# Patient Record
Sex: Male | Born: 1935 | Race: White | Hispanic: No | Marital: Single | Smoking: Current every day smoker
Health system: Southern US, Community
[De-identification: ages and names within clinical notes are randomized; demographics above are authoritative.]

## PROBLEM LIST (undated history)

## (undated) DIAGNOSIS — I10 Essential (primary) hypertension: Secondary | ICD-10-CM

## (undated) DIAGNOSIS — F0390 Unspecified dementia without behavioral disturbance: Secondary | ICD-10-CM

## (undated) DIAGNOSIS — C801 Malignant (primary) neoplasm, unspecified: Secondary | ICD-10-CM

## (undated) HISTORY — PX: PANCREAS SURGERY: SHX731

---

## 2007-10-29 ENCOUNTER — Emergency Department (HOSPITAL_COMMUNITY): Admission: EM | Admit: 2007-10-29 | Discharge: 2007-10-30 | Payer: Self-pay | Admitting: Emergency Medicine

## 2014-06-17 ENCOUNTER — Emergency Department (HOSPITAL_COMMUNITY): Payer: Non-veteran care

## 2014-06-17 ENCOUNTER — Encounter (HOSPITAL_COMMUNITY): Payer: Self-pay | Admitting: Emergency Medicine

## 2014-06-17 ENCOUNTER — Emergency Department (HOSPITAL_COMMUNITY)
Admission: EM | Admit: 2014-06-17 | Discharge: 2014-06-18 | Disposition: A | Discharge status: Other Acute Inpatient Hospital | Payer: Non-veteran care | Attending: Emergency Medicine | Admitting: Emergency Medicine

## 2014-06-17 DIAGNOSIS — Z8507 Personal history of malignant neoplasm of pancreas: Secondary | ICD-10-CM | POA: Diagnosis not present

## 2014-06-17 DIAGNOSIS — R0600 Dyspnea, unspecified: Secondary | ICD-10-CM

## 2014-06-17 DIAGNOSIS — R0602 Shortness of breath: Secondary | ICD-10-CM

## 2014-06-17 DIAGNOSIS — R059 Cough, unspecified: Secondary | ICD-10-CM

## 2014-06-17 DIAGNOSIS — R05 Cough: Secondary | ICD-10-CM

## 2014-06-17 DIAGNOSIS — M6281 Muscle weakness (generalized): Secondary | ICD-10-CM | POA: Diagnosis present

## 2014-06-17 DIAGNOSIS — I10 Essential (primary) hypertension: Secondary | ICD-10-CM | POA: Diagnosis not present

## 2014-06-17 DIAGNOSIS — J449 Chronic obstructive pulmonary disease, unspecified: Secondary | ICD-10-CM

## 2014-06-17 DIAGNOSIS — F0390 Unspecified dementia without behavioral disturbance: Secondary | ICD-10-CM | POA: Diagnosis not present

## 2014-06-17 DIAGNOSIS — R109 Unspecified abdominal pain: Secondary | ICD-10-CM | POA: Diagnosis not present

## 2014-06-17 DIAGNOSIS — J42 Unspecified chronic bronchitis: Secondary | ICD-10-CM

## 2014-06-17 HISTORY — DX: Malignant (primary) neoplasm, unspecified: C80.1

## 2014-06-17 HISTORY — DX: Unspecified dementia, unspecified severity, without behavioral disturbance, psychotic disturbance, mood disturbance, and anxiety: F03.90

## 2014-06-17 HISTORY — DX: Essential (primary) hypertension: I10

## 2014-06-17 LAB — CBC WITH DIFFERENTIAL/PLATELET
BASOS ABS: 0 10*3/uL (ref 0.0–0.1)
Basophils Relative: 0 % (ref 0–1)
Eosinophils Absolute: 0 10*3/uL (ref 0.0–0.7)
Eosinophils Relative: 0 % (ref 0–5)
HEMATOCRIT: 33.9 % — AB (ref 39.0–52.0)
HEMOGLOBIN: 11 g/dL — AB (ref 13.0–17.0)
LYMPHS PCT: 7 % — AB (ref 12–46)
Lymphs Abs: 0.6 10*3/uL — ABNORMAL LOW (ref 0.7–4.0)
MCH: 30.5 pg (ref 26.0–34.0)
MCHC: 32.4 g/dL (ref 30.0–36.0)
MCV: 93.9 fL (ref 78.0–100.0)
MONO ABS: 0.6 10*3/uL (ref 0.1–1.0)
MONOS PCT: 6 % (ref 3–12)
NEUTROS ABS: 7.9 10*3/uL — AB (ref 1.7–7.7)
Neutrophils Relative %: 87 % — ABNORMAL HIGH (ref 43–77)
Platelets: 249 10*3/uL (ref 150–400)
RBC: 3.61 MIL/uL — ABNORMAL LOW (ref 4.22–5.81)
RDW: 13.2 % (ref 11.5–15.5)
WBC: 9.1 10*3/uL (ref 4.0–10.5)

## 2014-06-17 LAB — URINALYSIS, ROUTINE W REFLEX MICROSCOPIC
Bilirubin Urine: NEGATIVE
GLUCOSE, UA: NEGATIVE mg/dL
Ketones, ur: 15 mg/dL — AB
LEUKOCYTES UA: NEGATIVE
Nitrite: NEGATIVE
PH: 6 (ref 5.0–8.0)
Protein, ur: NEGATIVE mg/dL
SPECIFIC GRAVITY, URINE: 1.015 (ref 1.005–1.030)
Urobilinogen, UA: 0.2 mg/dL (ref 0.0–1.0)

## 2014-06-17 LAB — URINE MICROSCOPIC-ADD ON

## 2014-06-17 LAB — HEPATIC FUNCTION PANEL
ALBUMIN: 3 g/dL — AB (ref 3.5–5.2)
ALT: 7 U/L (ref 0–53)
AST: 15 U/L (ref 0–37)
Alkaline Phosphatase: 126 U/L — ABNORMAL HIGH (ref 39–117)
TOTAL PROTEIN: 7 g/dL (ref 6.0–8.3)
Total Bilirubin: 0.5 mg/dL (ref 0.3–1.2)

## 2014-06-17 LAB — BASIC METABOLIC PANEL
ANION GAP: 11 (ref 5–15)
BUN: 21 mg/dL (ref 6–23)
CHLORIDE: 104 meq/L (ref 96–112)
CO2: 29 meq/L (ref 19–32)
CREATININE: 0.99 mg/dL (ref 0.50–1.35)
Calcium: 8.8 mg/dL (ref 8.4–10.5)
GFR calc Af Amer: 88 mL/min — ABNORMAL LOW (ref >90)
GFR calc non Af Amer: 76 mL/min — ABNORMAL LOW (ref >90)
Glucose, Bld: 118 mg/dL — ABNORMAL HIGH (ref 70–99)
POTASSIUM: 3.2 meq/L — AB (ref 3.7–5.3)
Sodium: 144 mEq/L (ref 137–147)

## 2014-06-17 LAB — LIPASE, BLOOD: Lipase: 9 U/L — ABNORMAL LOW (ref 11–59)

## 2014-06-17 LAB — D-DIMER, QUANTITATIVE: D-Dimer, Quant: 1.03 ug/mL-FEU — ABNORMAL HIGH (ref 0.00–0.48)

## 2014-06-17 MED ORDER — SODIUM CHLORIDE 0.9 % IV BOLUS (SEPSIS)
500.0000 mL | Freq: Once | INTRAVENOUS | Status: AC
  Administered 2014-06-17: 500 mL via INTRAVENOUS

## 2014-06-17 MED ORDER — POTASSIUM CHLORIDE CRYS ER 20 MEQ PO TBCR
40.0000 meq | EXTENDED_RELEASE_TABLET | Freq: Once | ORAL | Status: AC
  Administered 2014-06-17: 40 meq via ORAL
  Filled 2014-06-17: qty 2

## 2014-06-17 MED ORDER — IOHEXOL 350 MG/ML SOLN
100.0000 mL | Freq: Once | INTRAVENOUS | Status: AC | PRN
  Administered 2014-06-17: 100 mL via INTRAVENOUS

## 2014-06-17 MED ORDER — ALBUTEROL SULFATE (2.5 MG/3ML) 0.083% IN NEBU
5.0000 mg | INHALATION_SOLUTION | Freq: Once | RESPIRATORY_TRACT | Status: AC
  Administered 2014-06-17: 5 mg via RESPIRATORY_TRACT
  Filled 2014-06-17: qty 6

## 2014-06-17 MED ORDER — IOHEXOL 300 MG/ML  SOLN
25.0000 mL | INTRAMUSCULAR | Status: AC
  Administered 2014-06-17 (×2): 25 mL via ORAL

## 2014-06-17 MED ORDER — DEXTROSE 5 % IV SOLN
500.0000 mg | Freq: Once | INTRAVENOUS | Status: AC
  Administered 2014-06-17: 500 mg via INTRAVENOUS
  Filled 2014-06-17 (×2): qty 500

## 2014-06-17 MED ORDER — DEXTROSE 5 % IV SOLN
1.0000 g | Freq: Once | INTRAVENOUS | Status: AC
  Administered 2014-06-17: 1 g via INTRAVENOUS
  Filled 2014-06-17: qty 10

## 2014-06-17 MED ORDER — METHYLPREDNISOLONE SODIUM SUCC 125 MG IJ SOLR
125.0000 mg | Freq: Once | INTRAMUSCULAR | Status: AC
  Administered 2014-06-17: 125 mg via INTRAVENOUS
  Filled 2014-06-17: qty 2

## 2014-06-17 NOTE — ED Notes (Signed)
Pt is finish with contrast.

## 2014-06-17 NOTE — ED Notes (Signed)
Pt transported to [place] for CT scan

## 2014-06-17 NOTE — ED Notes (Signed)
CT informed pt finished contrast 

## 2014-06-17 NOTE — ED Notes (Signed)
MD at bedside. 

## 2014-06-17 NOTE — ED Notes (Signed)
Per EMS, states patient is supposed to returned to [facility] after CT scan.

## 2014-06-17 NOTE — ED Notes (Signed)
Talked with

## 2014-06-17 NOTE — ED Notes (Signed)
Patient transported to CT 

## 2014-06-17 NOTE — ED Provider Notes (Signed)
CSN: [id number]     Arrival date & time 06/17/14  1336 History  This chart was scribed for Shaune Pollack, MD by Steva Colder, ED Scribe. The patient was seen in room APA14/APA14 at 2:58 PM.     Chief Complaint  Patient presents with  . Weakness    Patient is a 80 y.o. male presenting with weakness.  Weakness This is a new problem. The current episode started more than 1 week ago. The problem occurs constantly. The problem has not changed since onset.Associated symptoms include abdominal pain and shortness of breath.   HPI Comments: Connor Cantu is a 80 y.o. male who presents to the Emergency Department complaining of weakness onset 1.5 weeks. He states that he is having associated symptoms of congestion, coughing, sneezing, SOB, appetite change. He states that he is drinking water just fine. He states that his cough is productive of yellow-green sputum.    He denies fever, and any other symptoms. He states that he is a smoker. He states that he smokes 3-8 ciggarettes a day. He states that he has a nebulizer and two inhalers. He states that he using them. He states that he was seen at [facility] last week so that his medicine could be increased for his dementia. He states that he lives with his granddaughter. He takes pancreatic enzymes. He states that he had pancreatic CA. He denies pneumonia shot in the last 3-4 years. He states that he is allergic to penicillin. Granddaughter states that when the pt gets steroids, the patients dementia will began..   Past Medical History  Diagnosis Date  . Hypertension   . Dementia   . Cancer     pancreatic   Past Surgical History  Procedure Laterality Date  . Pancreas surgery      whipple   History reviewed. No pertinent family history. History  Substance Use Topics  . Smoking status: Current Every Day Smoker -- 1.00 packs/day  . Smokeless tobacco: Not on file  . Alcohol Use: Not on file    Review of Systems  Constitutional: Positive for appetite  change. Negative for fever.  HENT: Positive for congestion and sneezing.   Respiratory: Positive for cough and shortness of breath.   Gastrointestinal: Positive for abdominal pain.  Neurological: Positive for weakness.  All other systems reviewed and are negative.   Allergies  Penicillins  Home Medications   Prior to Admission medications   Not on File   BP 140/89  Pulse 61  Temp(Src) 98.2 F (36.8 C) (Oral)  Resp 16  SpO2 95%  Physical Exam  Nursing note and vitals reviewed. Constitutional: He is oriented to person, place, and time. He appears well-developed. He appears cachectic.  HENT:  Head: Normocephalic and atraumatic.  Mouth/Throat: Oropharynx is clear and moist.  Bridge noted   Eyes: Conjunctivae and EOM are normal. Pupils are equal, round, and reactive to light.  Neck: Normal range of motion. Neck supple.  Cardiovascular: Normal rate, regular rhythm and normal heart sounds.   Pulmonary/Chest: Effort normal.  Diffusely congested sounding  Abdominal: Soft. There is tenderness (diffused).  [place] healing Surgical scar vertically on the abdomen. Tenderness to palpation  Genitourinary: Testes normal and penis normal.  No palpable hernia  Neurological: He is alert and oriented to person, place, and time. He has normal strength and normal reflexes. No cranial nerve deficit or sensory deficit. He displays a negative Romberg sign.  Skin: Skin is warm and dry.    ED Course  Procedures (including critical care time) DIAGNOSTIC STUDIES: Oxygen Saturation is 95% on room air, adequate by my interpretation.    COORDINATION OF CARE: 3:11 PM-Discussed treatment plan which includes CXR, CMAT with pt at bedside and pt agreed to plan.   Labs Review Labs Reviewed  CBC WITH DIFFERENTIAL - Abnormal; Notable for the following:    RBC 3.61 (*)    Hemoglobin 11.0 (*)    HCT 33.9 (*)    Neutrophils Relative % 87 (*)    Neutro Abs 7.9 (*)    Lymphocytes Relative 7 (*)     Lymphs Abs 0.6 (*)    All other components within normal limits  BASIC METABOLIC PANEL    Imaging Review Dg Chest 2 View  06/17/2014   CLINICAL DATA:  Cough for a month. Shortness of breath and chest pain. Initial encounter.  EXAM: CHEST  2 VIEW  COMPARISON:  None.  FINDINGS: Hyper expansion is consistent with emphysema. Interstitial markings are diffusely coarsened with chronic features. Biapical pleural parenchymal scarring is only minimally asymmetric, right greater than left. There is no edema or focal airspace consolidation. No pleural effusion. The cardiopericardial silhouette is within normal limits for size. Imaged bony structures of the thorax are intact.  IMPRESSION: Emphysema without acute cardiopulmonary findings.   Electronically Signed   By: Misty Stanley M.D.   On: 06/17/2014 14:29     EKG Interpretation   Date/Time:  Friday June 17 2014 13:44:01 EDT Ventricular Rate:  110 PR Interval:  134 QRS Duration: 88 QT Interval:  334 QTC Calculation: 452 R Axis:   78 Text Interpretation:  Sinus tachycardia Biatrial enlargement Abnormal ECG  Confirmed by Lacinda Axon  MD, BRIAN ([id number]) on 06/17/2014 1:51:28 PM      Results for orders placed during the hospital encounter of 06/17/14  CBC WITH DIFFERENTIAL      Result Value Ref Range   WBC 9.1  4.0 - 10.5 K/uL   RBC 3.61 (*) 4.22 - 5.81 MIL/uL   Hemoglobin 11.0 (*) 13.0 - 17.0 g/dL   HCT 33.9 (*) 39.0 - 52.0 %   MCV 93.9  78.0 - 100.0 fL   MCH 30.5  26.0 - 34.0 pg   MCHC 32.4  30.0 - 36.0 g/dL   RDW 13.2  11.5 - 15.5 %   Platelets 249  150 - 400 K/uL   Neutrophils Relative % 87 (*) 43 - 77 %   Neutro Abs 7.9 (*) 1.7 - 7.7 K/uL   Lymphocytes Relative 7 (*) 12 - 46 %   Lymphs Abs 0.6 (*) 0.7 - 4.0 K/uL   Monocytes Relative 6  3 - 12 %   Monocytes Absolute 0.6  0.1 - 1.0 K/uL   Eosinophils Relative 0  0 - 5 %   Eosinophils Absolute 0.0  0.0 - 0.7 K/uL   Basophils Relative 0  0 - 1 %   Basophils Absolute 0.0  0.0 - 0.1 K/uL   BASIC METABOLIC PANEL      Result Value Ref Range   Sodium 144  137 - 147 mEq/L   Potassium 3.2 (*) 3.7 - 5.3 mEq/L   Chloride 104  96 - 112 mEq/L   CO2 29  19 - 32 mEq/L   Glucose, Bld 118 (*) 70 - 99 mg/dL   BUN 21  6 - 23 mg/dL   Creatinine, Ser 0.99  0.50 - 1.35 mg/dL   Calcium 8.8  8.4 - 10.5 mg/dL   GFR calc non Af Wyvonnia Lora  76 (*) >90 mL/min   GFR calc Af Amer 88 (*) >90 mL/min   Anion gap 11  5 - 15  HEPATIC FUNCTION PANEL      Result Value Ref Range   Total Protein 7.0  6.0 - 8.3 g/dL   Albumin 3.0 (*) 3.5 - 5.2 g/dL   AST 15  0 - 37 U/L   ALT 7  0 - 53 U/L   Alkaline Phosphatase 126 (*) 39 - 117 U/L   Total Bilirubin 0.5  0.3 - 1.2 mg/dL   Bilirubin, Direct <0.2  0.0 - 0.3 mg/dL   Indirect Bilirubin NOT CALCULATED  0.3 - 0.9 mg/dL  LIPASE, BLOOD      Result Value Ref Range   Lipase 9 (*) 11 - 59 U/L     MDM   Final diagnoses:  Cough   1- dyspnea- copd, no infiltrate on cxr, breaths sound with some rhonchi and wheezing but does not appear to be in any respiratory distress and, it is not clear that this primarily a copd exacerbation.  Plan solumedrol, neb, and abx given moderate exacerbation with change in sputum color, volume, and quantity.  Patient to have ct to assess for pe. 2- abdominal pain- patient with history of pancreatic cancer s/p Whipple several years ago and reported as cure but no records in our system and was done at [place].  patient with diffuse abdominal ttp on exam although he had not previously compplained of this.  He does have weight loss and is cachectic with gd stating he has lost 10 pound in several weeks with weight down to about 100.  80 y.o. Male with history of pancreatic cancer treated with whipple presents today with dyspnea, weight loss and abdominal pain.  CT scanner currently down for unknown time.    Discussed with patient, granddaughter and Dr. Winfred Leeds.  Plan transfer to [facility] e to obtain ct chest and abdomen and disposition to  inpatient there if indicated or discharge to home with granddaughter.  I personally performed the services described in this documentation, which was scribed in my presence. The recorded information has been reviewed and is accurate.    Shaune Pollack, MD 06/17/14 [phone]

## 2014-06-17 NOTE — ED Notes (Addendum)
Pt reports generalized weakness, cough, abdominal pain x1 week. Per pt family pt has lost appetite as well. Pt seen at [facility] for same last week. No dyspnea noted in triage. Pt reports chest pain/tightness with coughing.Congested cough noted in triage. No dyspnea noted.airway patent.

## 2014-06-17 NOTE — ED Notes (Signed)
Pt has grandaughter present at bedside. She states that he hasn't been eating right lately and has lost weight. Grand-daughter is POA.

## 2014-06-17 NOTE — ED Notes (Signed)
[phone] Edd Arbour , RN [place].

## 2014-06-17 NOTE — ED Notes (Signed)
Pt made aware CT machine is down. Plan of care being discussed with family by Dr. Jeanell Sparrow.

## 2014-06-17 NOTE — ED Provider Notes (Addendum)
Level V caveat, dementia and history is obtained from patient and his granddaughter with whom he lives Patient reports postnasal drip, cough and lower abdominal pain worse with coughing for the past several weeks. No fever. No shortness of breath. No other associated symptoms. On exam a cachectic chronically appearing male alert Glasgow Coma Score 15, no distress. Lungs diffuse scattered rhonchi abdomen nondistended mild tenderness at the periumbilical area and suprapubic area. Extremities without edema.  Orlie Dakin, MD 06/17/14 [place], MD 06/17/14 2231  12:45 AM patient resting comfortably no distress. Speaks in. I had lengthy (greater than 5 minutes) discussion with patient and with his granddaughter and caregiver, that he needs to stop smoking. I discussed CT results. Patient has been by his physicians at [facility] hospital that he has an abnormal left kidney. He does not wish further evaluation. Results for orders placed during the hospital encounter of 06/17/14  CBC WITH DIFFERENTIAL      Result Value Ref Range   WBC 9.1  4.0 - 10.5 K/uL   RBC 3.61 (*) 4.22 - 5.81 MIL/uL   Hemoglobin 11.0 (*) 13.0 - 17.0 g/dL   HCT 33.9 (*) 39.0 - 52.0 %   MCV 93.9  78.0 - 100.0 fL   MCH 30.5  26.0 - 34.0 pg   MCHC 32.4  30.0 - 36.0 g/dL   RDW 13.2  11.5 - 15.5 %   Platelets 249  150 - 400 K/uL   Neutrophils Relative % 87 (*) 43 - 77 %   Neutro Abs 7.9 (*) 1.7 - 7.7 K/uL   Lymphocytes Relative 7 (*) 12 - 46 %   Lymphs Abs 0.6 (*) 0.7 - 4.0 K/uL   Monocytes Relative 6  3 - 12 %   Monocytes Absolute 0.6  0.1 - 1.0 K/uL   Eosinophils Relative 0  0 - 5 %   Eosinophils Absolute 0.0  0.0 - 0.7 K/uL   Basophils Relative 0  0 - 1 %   Basophils Absolute 0.0  0.0 - 0.1 K/uL  BASIC METABOLIC PANEL      Result Value Ref Range   Sodium 144  137 - 147 mEq/L   Potassium 3.2 (*) 3.7 - 5.3 mEq/L   Chloride 104  96 - 112 mEq/L   CO2 29  19 - 32 mEq/L   Glucose, Bld 118 (*) 70 - 99 mg/dL   BUN 21   6 - 23 mg/dL   Creatinine, Ser 0.99  0.50 - 1.35 mg/dL   Calcium 8.8  8.4 - 10.5 mg/dL   GFR calc non Af Amer 76 (*) >90 mL/min   GFR calc Af Amer 88 (*) >90 mL/min   Anion gap 11  5 - 15  HEPATIC FUNCTION PANEL      Result Value Ref Range   Total Protein 7.0  6.0 - 8.3 g/dL   Albumin 3.0 (*) 3.5 - 5.2 g/dL   AST 15  0 - 37 U/L   ALT 7  0 - 53 U/L   Alkaline Phosphatase 126 (*) 39 - 117 U/L   Total Bilirubin 0.5  0.3 - 1.2 mg/dL   Bilirubin, Direct <0.2  0.0 - 0.3 mg/dL   Indirect Bilirubin NOT CALCULATED  0.3 - 0.9 mg/dL  LIPASE, BLOOD      Result Value Ref Range   Lipase 9 (*) 11 - 59 U/L  D-DIMER, QUANTITATIVE      Result Value Ref Range   D-Dimer, Quant 1.03 (*) 0.00 -  0.48 ug/mL-FEU  URINALYSIS, ROUTINE W REFLEX MICROSCOPIC      Result Value Ref Range   Color, Urine YELLOW  YELLOW   APPearance CLEAR  CLEAR   Specific Gravity, Urine 1.015  1.005 - 1.030   pH 6.0  5.0 - 8.0   Glucose, UA NEGATIVE  NEGATIVE mg/dL   Hgb urine dipstick TRACE (*) NEGATIVE   Bilirubin Urine NEGATIVE  NEGATIVE   Ketones, ur 15 (*) NEGATIVE mg/dL   Protein, ur NEGATIVE  NEGATIVE mg/dL   Urobilinogen, UA 0.2  0.0 - 1.0 mg/dL   Nitrite NEGATIVE  NEGATIVE   Leukocytes, UA NEGATIVE  NEGATIVE  URINE MICROSCOPIC-ADD ON      Result Value Ref Range   Squamous Epithelial / LPF RARE  RARE   RBC / HPF 0-2  <3 RBC/hpf   Bacteria, UA RARE  RARE   Urine-Other MICROSCOPIC EXAM PERFORMED ON UNCONCENTRATED URINE     Dg Chest 2 View  06/17/2014   CLINICAL DATA:  Cough for a month. Shortness of breath and chest pain. Initial encounter.  EXAM: CHEST  2 VIEW  COMPARISON:  None.  FINDINGS: Hyper expansion is consistent with emphysema. Interstitial markings are diffusely coarsened with chronic features. Biapical pleural parenchymal scarring is only minimally asymmetric, right greater than left. There is no edema or focal airspace consolidation. No pleural effusion. The cardiopericardial silhouette is within normal  limits for size. Imaged bony structures of the thorax are intact.  IMPRESSION: Emphysema without acute cardiopulmonary findings.   Electronically Signed   By: Misty Stanley M.D.   On: 06/17/2014 14:29   Ct Angio Chest Pe W/cm &/or Wo Cm  06/18/2014   CLINICAL DATA:  Patient complains of pain to lower chest and epigastric area. Nausea.  EXAM: CT ANGIOGRAPHY CHEST  CT ABDOMEN AND PELVIS WITH CONTRAST  TECHNIQUE: Multidetector CT imaging of the chest was performed using the standard protocol during bolus administration of intravenous contrast. Multiplanar CT image reconstructions and MIPs were obtained to evaluate the vascular anatomy. Multidetector CT imaging of the abdomen and pelvis was performed using the standard protocol during bolus administration of intravenous contrast.  CONTRAST:  150mL OMNIPAQUE IOHEXOL 350 MG/ML SOLN  COMPARISON:  None.  FINDINGS: CTA CHEST FINDINGS  Mediastinum: The heart size appears normal. There is no pericardial effusion identified. Calcified atherosclerotic plaque involves the LAD and RCA coronary arteries. The trachea appears patent and is midline. Mild circumferential wall thickening involving the esophagus is noted. No enlarged mediastinal or hilar lymph nodes identified. The main pulmonary artery appears patent. There are no abnormal filling defects within the main pulmonary artery or its branches to suggest an acute pulmonary embolus.  Lungs/Pleura: Moderate to advanced changes of centrilobular and paraseptal emphysema identified. Moderate bilateral bronchial wall thickening is identified. Scattered areas of tree-in-bud nodularity are identified and appear lower lobe predominant compatible with bronchiolitis. Focal area of subsegmental consolidation or atelectasis is noted in the left base. Scarring is noted within both lung apices.  Musculoskeletal: There is mild multi level spondylosis and mild scoliosis involving the thoracic spine. No aggressive lytic or sclerotic bone  lesions.  CT ABDOMEN and PELVIS FINDINGS  Hepatobiliary: Cyst is noted within the right hepatic lobe. 5 mm low attenuation structure in the lateral segment of left hepatic lobe is too small to characterize. Previous cholecystectomy. There is pneumobilia compatible with biliary patency. No significant biliary dilatation.  Pancreas: Previous Whipple procedure.  Spleen: The spleen appears normal. The urinary bladder appears normal.  Adrenals/Urinary  Tract: Bilateral renal cysts are identified. Several complex cysts are noted within the left kidney. Index lesion within the upper pole measures 1.1 cm and has internal Hounsfield units as high as 98 suggesting enhancement. No evidence for obstructive uropathy.  Stomach/Bowel: The stomach is unremarkable. Postoperative change from previous Whipple procedure identified. The small bowel loops have a normal course and caliber. There is no evidence for bowel obstruction. The colon is unremarkable. A moderate stool burden is identified within the rectum.  Vascular/Lymphatic: Calcified atherosclerotic disease involves the abdominal aorta. No aneurysm. There is no retroperitoneal adenopathy identified. There is no pelvic or inguinal adenopathy identified.  Reproductive: Prostate gland is enlarged.  Other: No ascites or fluid collections within the abdomen or pelvis.  Musculoskeletal: Degenerative disc disease is noted within the lumbar spine. There are no aggressive lytic or sclerotic bone lesions identified.  Review of the MIP images confirms the above findings.  IMPRESSION: 1. No acute findings within the chest. No evidence for acute pulmonary embolus. 2. Emphysema. 3. Diffuse bronchial wall thickening and evidence of bronchiolitis. Findings are likely chronic. 4. No acute findings within the abdomen or pelvis. 5. Status post Whipple procedure. Pneumobilia is noted compatible with biliary patency. 6. 5 mm low attenuation structure in the left hepatic lobe is too small to  characterize. 7. There is a possible enhancing lesion within the left kidney. Further evaluation with nonemergent MRI of the kidneys is recommended to evaluate for solid kidney lesion which may indicate a renal cell carcinoma. 8. Atherosclerotic disease including multi vessel coronary artery calcifications. 9. Prostate gland enlargement. 10. Scoliosis and degenerative disc disease within the thoracic and lumbar spine.   Electronically Signed   By: Kerby Moors M.D.   On: 06/18/2014 00:34   Ct Abdomen Pelvis W Contrast  06/18/2014   CLINICAL DATA:  Abdominal pain. History of Whipple procedure for pancreatic cancer.  EXAM: CT ABDOMEN AND PELVIS WITH CONTRAST  TECHNIQUE: Multidetector CT imaging of the abdomen and pelvis was performed using the standard protocol following bolus administration of intravenous contrast.  CONTRAST:  165mL OMNIPAQUE IOHEXOL 350 MG/ML SOLN  COMPARISON:  None.  FINDINGS: There is edema in the distal esophagus, nonspecific. Evidence of previous Whipple procedure. No evidence of metastatic disease. There is air in the biliary tree. There is portal vein distention with extensive varices in the splenic hilum. The spleen is not enlarged. The pancreatic remnant appears normal. Adrenal glands are normal. There are multiple benign cysts in both kidneys.  There is moderate left hydronephrosis suggesting ureteral pelvic junction partial obstruction. This could be functional. The ureter is not dilated. Right renal pelvis is minimally prominent but there is no obstruction.  There are no dilated loops of large or small bowel. No free air free fluid in the abdomen. No acute osseous abnormalities. Extensive calcification in the abdominal aorta and iliac arteries.  IMPRESSION: 1. No acute abnormalities of the abdomen or pelvis. Whipple procedure without evidence of metastatic disease. 2. Findings consistent with portal hypertension.   Electronically Signed   By: Rozetta Nunnery M.D.   On: 06/18/2014 00:39     Dx #1 chronic bronchitis #2 nonspecific abdominal pain #3 hypokalemia #4 tobacco abuse .   Orlie Dakin, MD 06/18/14 [phone]

## 2014-06-17 NOTE — ED Notes (Signed)
Per EMS - Pt is from [facility], for weakness x6 months. Pt hx of dementia. Pt has productive cough. [facility] CT scanner is broken and hes here for scan. Was not given oral contrast. CT notifed to bring contrast. Dr. Milagros Evener at bedside.

## 2014-06-18 NOTE — Discharge Instructions (Signed)
Chronic Obstructive Pulmonary Disease Ask your doctors to help you to stop smoking. You have an abnormality on your left kidney which is possibly cancer. Ask your doctor at the [facility] hospital to order an MRI scan of your abdomen if you wish to have further evaluation. Chronic obstructive pulmonary disease (COPD) is a common lung condition in which airflow from the lungs is limited. COPD is a general term that can be used to describe many different lung problems that limit airflow, including both chronic bronchitis and emphysema. If you have COPD, your lung function will probably never return to normal, but there are measures you can take to improve lung function and make yourself feel better.  CAUSES   Smoking (common).   Exposure to secondhand smoke.   Genetic problems.  Chronic inflammatory lung diseases or recurrent infections. SYMPTOMS   Shortness of breath, especially with physical activity.   Deep, persistent (chronic) cough with a large amount of thick mucus.   Wheezing.   Rapid breaths (tachypnea).   Gray or bluish discoloration (cyanosis) of the skin, especially in fingers, toes, or lips.   Fatigue.   Weight loss.   Frequent infections or episodes when breathing symptoms become much worse (exacerbations).   Chest tightness. DIAGNOSIS  Your health care provider will take a medical history and perform a physical examination to make the initial diagnosis. Additional tests for COPD may include:   Lung (pulmonary) function tests.  Chest X-ray.  CT scan.  Blood tests. TREATMENT  Treatment available to help you feel better when you have COPD includes:   Inhaler and nebulizer medicines. These help manage the symptoms of COPD and make your breathing more comfortable.  Supplemental oxygen. Supplemental oxygen is only helpful if you have a low oxygen level in your blood.   Exercise and physical activity. These are beneficial for nearly all people with COPD.  Some people may also benefit from a pulmonary rehabilitation program. HOME CARE INSTRUCTIONS   Take all medicines (inhaled or pills) as directed by your health care provider.  Avoid over-the-counter medicines or cough syrups that dry up your airway (such as antihistamines) and slow down the elimination of secretions unless instructed otherwise by your health care provider.   If you are a smoker, the most important thing that you can do is stop smoking. Continuing to smoke will cause further lung damage and breathing trouble. Ask your health care provider for help with quitting smoking. He or she can direct you to community resources or hospitals that provide support.  Avoid exposure to irritants such as smoke, chemicals, and fumes that aggravate your breathing.  Use oxygen therapy and pulmonary rehabilitation if directed by your health care provider. If you require home oxygen therapy, ask your health care provider whether you should purchase a pulse oximeter to measure your oxygen level at home.   Avoid contact with individuals who have a contagious illness.  Avoid extreme temperature and humidity changes.  Eat healthy foods. Eating smaller, more frequent meals and resting before meals may help you maintain your strength.  Stay active, but balance activity with periods of rest. Exercise and physical activity will help you maintain your ability to do things you want to do.  Preventing infection and hospitalization is very important when you have COPD. Make sure to receive all the vaccines your health care provider recommends, especially the pneumococcal and influenza vaccines. Ask your health care provider whether you need a pneumonia vaccine.  Learn and use relaxation techniques to manage  stress.  Learn and use controlled breathing techniques as directed by your health care provider. Controlled breathing techniques include:   Pursed lip breathing. Start by breathing in (inhaling)  through your nose for 1 second. Then, purse your lips as if you were going to whistle and breathe out (exhale) through the pursed lips for 2 seconds.   Diaphragmatic breathing. Start by putting one hand on your abdomen just above your waist. Inhale slowly through your nose. The hand on your abdomen should move out. Then purse your lips and exhale slowly. You should be able to feel the hand on your abdomen moving in as you exhale.   Learn and use controlled coughing to clear mucus from your lungs. Controlled coughing is a series of short, progressive coughs. The steps of controlled coughing are:  1. Lean your head slightly forward.  2. Breathe in deeply using diaphragmatic breathing.  3. Try to hold your breath for 3 seconds.  4. Keep your mouth slightly open while coughing twice.  5. Spit any mucus out into a tissue.  6. Rest and repeat the steps once or twice as needed. SEEK MEDICAL CARE IF:   You are coughing up more mucus than usual.   There is a change in the color or thickness of your mucus.   Your breathing is more labored than usual.   Your breathing is faster than usual.  SEEK IMMEDIATE MEDICAL CARE IF:   You have shortness of breath while you are resting.   You have shortness of breath that prevents you from:  Being able to talk.   Performing your usual physical activities.   You have chest pain lasting longer than 5 minutes.   Your skin color is more cyanotic than usual.  You measure low oxygen saturations for longer than 5 minutes with a pulse oximeter. MAKE SURE YOU:   Understand these instructions.  Will watch your condition.  Will get help right away if you are not doing well or get worse. Document Released: 05/15/2005 Document Revised: 12/20/2013 Document Reviewed: 04/01/2013 [facility] Patient Information 2015 [place], [place]. This information is not intended to replace advice given to you by your health care provider. Make sure you discuss any  questions you have with your health care provider.

## 2014-12-03 ENCOUNTER — Encounter (HOSPITAL_COMMUNITY): Payer: Self-pay | Admitting: *Deleted

## 2014-12-03 ENCOUNTER — Emergency Department (HOSPITAL_COMMUNITY)
Admission: EM | Admit: 2014-12-03 | Discharge: 2014-12-03 | Disposition: A | Discharge status: Home / Self Care | Payer: Non-veteran care | Attending: Emergency Medicine | Admitting: Emergency Medicine

## 2014-12-03 DIAGNOSIS — Z880 Allergy status to penicillin: Secondary | ICD-10-CM | POA: Insufficient documentation

## 2014-12-03 DIAGNOSIS — F0390 Unspecified dementia without behavioral disturbance: Secondary | ICD-10-CM | POA: Insufficient documentation

## 2014-12-03 DIAGNOSIS — Y9389 Activity, other specified: Secondary | ICD-10-CM | POA: Diagnosis not present

## 2014-12-03 DIAGNOSIS — J029 Acute pharyngitis, unspecified: Secondary | ICD-10-CM | POA: Diagnosis not present

## 2014-12-03 DIAGNOSIS — Z7982 Long term (current) use of aspirin: Secondary | ICD-10-CM | POA: Insufficient documentation

## 2014-12-03 DIAGNOSIS — Z79899 Other long term (current) drug therapy: Secondary | ICD-10-CM | POA: Insufficient documentation

## 2014-12-03 DIAGNOSIS — Y9289 Other specified places as the place of occurrence of the external cause: Secondary | ICD-10-CM | POA: Insufficient documentation

## 2014-12-03 DIAGNOSIS — Z8507 Personal history of malignant neoplasm of pancreas: Secondary | ICD-10-CM | POA: Insufficient documentation

## 2014-12-03 DIAGNOSIS — Y998 Other external cause status: Secondary | ICD-10-CM | POA: Insufficient documentation

## 2014-12-03 DIAGNOSIS — T7840XA Allergy, unspecified, initial encounter: Secondary | ICD-10-CM | POA: Insufficient documentation

## 2014-12-03 DIAGNOSIS — I10 Essential (primary) hypertension: Secondary | ICD-10-CM | POA: Diagnosis not present

## 2014-12-03 DIAGNOSIS — Z720 Tobacco use: Secondary | ICD-10-CM | POA: Diagnosis not present

## 2014-12-03 DIAGNOSIS — X58XXXA Exposure to other specified factors, initial encounter: Secondary | ICD-10-CM | POA: Insufficient documentation

## 2014-12-03 DIAGNOSIS — Z7951 Long term (current) use of inhaled steroids: Secondary | ICD-10-CM | POA: Insufficient documentation

## 2014-12-03 LAB — RAPID STREP SCREEN (MED CTR MEBANE ONLY): STREPTOCOCCUS, GROUP A SCREEN (DIRECT): NEGATIVE

## 2014-12-03 MED ORDER — DIPHENHYDRAMINE HCL 25 MG PO TABS: 25.0000 mg | ORAL_TABLET | Freq: Four times a day (QID) | ORAL | Status: AC

## 2014-12-03 MED ORDER — DIPHENHYDRAMINE HCL 25 MG PO CAPS
25.0000 mg | ORAL_CAPSULE | Freq: Once | ORAL | Status: AC
  Administered 2014-12-03: 25 mg via ORAL
  Filled 2014-12-03: qty 1

## 2014-12-03 MED ORDER — PREDNISONE 10 MG PO TABS
60.0000 mg | ORAL_TABLET | Freq: Once | ORAL | Status: AC
  Administered 2014-12-03: 60 mg via ORAL
  Filled 2014-12-03 (×2): qty 1

## 2014-12-03 MED ORDER — PREDNISONE 20 MG PO TABS: 20.0000 mg | ORAL_TABLET | Freq: Two times a day (BID) | ORAL | Status: AC

## 2014-12-03 NOTE — ED Notes (Signed)
Pt alert & oriented x4, stable gait. Patient given discharge instructions, paperwork & prescription(s). Patient  instructed to stop at the registration desk to finish any additional paperwork. Patient verbalized understanding. Pt left department w/ no further questions. 

## 2014-12-03 NOTE — ED Notes (Signed)
Pt c/o sob, throat swelling,difficulty swallowing, throat pain that started after taking oxycodone last night. Pt reports that he had bladder surgery a few days ago at the [facility] in [place],

## 2014-12-03 NOTE — Discharge Instructions (Signed)
Do not take any additional oxycodone.  Prednisone, and Benadryl, for the next 48 hours.  Return to ER with any return of symptoms.

## 2014-12-03 NOTE — ED Provider Notes (Signed)
CSN: [id number]     Arrival date & time 12/03/14  1835 History   First MD Initiated Contact with Patient 12/03/14 1842     Chief Complaint  Patient presents with  . Allergic Reaction      HPI  Patient presents for evaluation with concern of a possible medication reaction. 72 hours ago he had a transurethral resection of a bladder tumor. Was given Percocet for postoperative pain. He took a dose yesterday. Started feeling some pain and a sensation of swelling in his throat in an discomfort with swallowing. He is able to swallow both liquids and solids. Has an occasional cough. No stridor. No drooling. No hives or itching of the skin. No GI upset.  Past Medical History  Diagnosis Date  . Hypertension   . Dementia   . Cancer     pancreatic   Past Surgical History  Procedure Laterality Date  . Pancreas surgery      whipple   No family history on file. History  Substance Use Topics  . Smoking status: Current Every Day Smoker -- 1.00 packs/day  . Smokeless tobacco: Not on file  . Alcohol Use: Not on file    Review of Systems  Constitutional: Negative for fever, chills, diaphoresis, appetite change and fatigue.  HENT: Positive for sore throat and trouble swallowing. Negative for mouth sores.   Eyes: Negative for visual disturbance.  Respiratory: Negative for cough, chest tightness, shortness of breath and wheezing.   Cardiovascular: Negative for chest pain.  Gastrointestinal: Negative for nausea, vomiting, abdominal pain, diarrhea and abdominal distention.  Endocrine: Negative for polydipsia, polyphagia and polyuria.  Genitourinary: Negative for dysuria, frequency and hematuria.  Musculoskeletal: Negative for gait problem.  Skin: Negative for color change, pallor and rash.  Neurological: Negative for dizziness, syncope, light-headedness and headaches.  Hematological: Does not bruise/bleed easily.  Psychiatric/Behavioral: Negative for behavioral problems and confusion.       Allergies  Penicillins  Home Medications   Prior to Admission medications   Medication Sig Start Date End Date Taking? Authorizing Provider  albuterol (PROVENTIL HFA;VENTOLIN HFA) 108 (90 BASE) MCG/ACT inhaler Inhale 2 puffs into the lungs every 6 (six) hours as needed for wheezing or shortness of breath.   Yes Historical Provider, MD  albuterol (PROVENTIL) (2.5 MG/3ML) 0.083% nebulizer solution Take 2.5 mg by nebulization 2 (two) times daily as needed for wheezing or shortness of breath.   Yes Historical Provider, MD  aspirin EC 81 MG tablet Take 81 mg by mouth daily.   Yes Historical Provider, MD  dextromethorphan-guaiFENesin (ROBITUSSIN-DM) 10-100 MG/5ML liquid Take 10 mLs by mouth every 4 (four) hours as needed for cough.   Yes Historical Provider, MD  docusate sodium (COLACE) 100 MG capsule Take 100 mg by mouth 2 (two) times daily.   Yes Historical Provider, MD  finasteride (PROSCAR) 5 MG tablet Take 5 mg by mouth daily.   Yes Historical Provider, MD  fluticasone (FLONASE) 50 MCG/ACT nasal spray Place 2 sprays into both nostrils daily.   Yes Historical Provider, MD  lipase/protease/amylase (CREON) 12000 UNITS CPEP capsule Take 12,000 Units by mouth 2 (two) times daily with a meal.   Yes Historical Provider, MD  OLANZapine (ZYPREXA) 5 MG tablet Take 5 mg by mouth 2 (two) times daily.    Yes Historical Provider, MD  omeprazole (PRILOSEC) 10 MG capsule Take 10 mg by mouth daily.   Yes Historical Provider, MD  oxybutynin (DITROPAN) 5 MG tablet Take 5 mg by mouth 3 (three) times  daily.   Yes Historical Provider, MD  oxycodone (OXY-IR) 5 MG capsule Take 5 mg by mouth every 4 (four) hours as needed for pain.   Yes Historical Provider, MD  aspirin 325 MG tablet Take 325 mg by mouth daily.    Historical Provider, MD  diphenhydrAMINE (BENADRYL) 25 MG tablet Take 1 tablet (25 mg total) by mouth every 6 (six) hours. 12/03/14   Tanna Furry, MD  predniSONE (DELTASONE) 20 MG tablet Take 1 tablet  (20 mg total) by mouth 2 (two) times daily. 1 po bid 12/03/14   Tanna Furry, MD   BP 164/70 mmHg  Pulse 83  Temp(Src) 98 F (36.7 C) (Oral)  Resp 20  Ht 5\' 10"  (1.778 m)  Wt 118 lb (53.524 kg)  BMI 16.93 kg/m2  SpO2 97% Physical Exam  Constitutional: He is oriented to person, place, and time. He appears well-developed and well-nourished. No distress.  HENT:  Head: Normocephalic.  Erythema of the soft palate. No soft tissue swelling of the throat. Uvula midline and not edematous. No stridor. No drooling. He is not dysphonic or hoarse.  Eyes: Conjunctivae are normal. Pupils are equal, round, and reactive to light. No scleral icterus.  Neck: Normal range of motion. Neck supple. No thyromegaly present.  Cardiovascular: Normal rate and regular rhythm.  Exam reveals no gallop and no friction rub.   No murmur heard. Pulmonary/Chest: Effort normal and breath sounds normal. No respiratory distress. He has no wheezes. He has no rales.  Abdominal: Soft. Bowel sounds are normal. He exhibits no distension. There is no tenderness. There is no rebound.  Musculoskeletal: Normal range of motion.  Neurological: He is alert and oriented to person, place, and time.  Skin: Skin is warm and dry. No rash noted.  Psychiatric: He has a normal mood and affect. His behavior is normal.    ED Course  Procedures (including critical care time) Labs Review Labs Reviewed  RAPID STREP SCREEN  CULTURE, GROUP A STREP    Imaging Review No results found.   EKG Interpretation None      MDM   Final diagnoses:  Allergic reaction, initial encounter    Patient's symptoms much improved. Negative strep. Exam shows resolved erythema and no soft tissue swelling. No dyspnea, stridor, or dysphagia. He is appropriate for discharge home. Discontinue his oxycodone. 24-hour course of prednisone and Benadryl. ER with any worsening or recurrence.    Tanna Furry, MD 12/05/14 [phone]

## 2014-12-06 LAB — CULTURE, GROUP A STREP: STREP A CULTURE: NEGATIVE

## 2016-05-04 IMAGING — CT CT ANGIO CHEST
2 of 14 series · 15 of 46 positions shown · IV contrast (Omni 300)
Comparison: None.

CLINICAL DATA: Patient complains of pain to lower chest and
epigastric area. Nausea.

EXAM:
CT ANGIOGRAPHY CHEST
CT ABDOMEN AND PELVIS WITH CONTRAST
TECHNIQUE: Multidetector CT imaging of the chest was performed using the
standard protocol during bolus administration of intravenous
contrast. Multiplanar CT image reconstructions and MIPs were
obtained to evaluate the vascular anatomy. Multidetector CT imaging
of the abdomen and pelvis was performed using the standard protocol
during bolus administration of intravenous contrast.
CONTRAST:  100mL OMNIPAQUE IOHEXOL 350 MG/ML SOLN

[Series 5: thins · axial · 0.63mm/px · z∈[+1234,+1529]mm · 13 of 345 slices shown]
[im 25/345  lung]
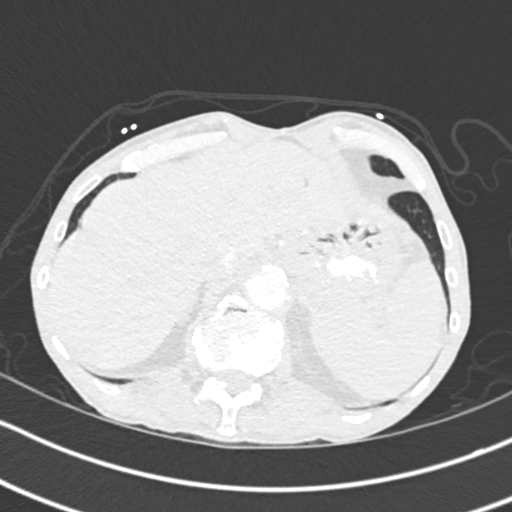
[im 50/345  soft-tissue]
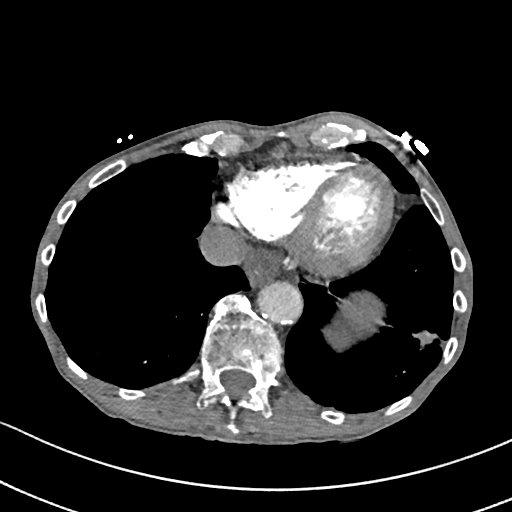
[im 74/345  lung]
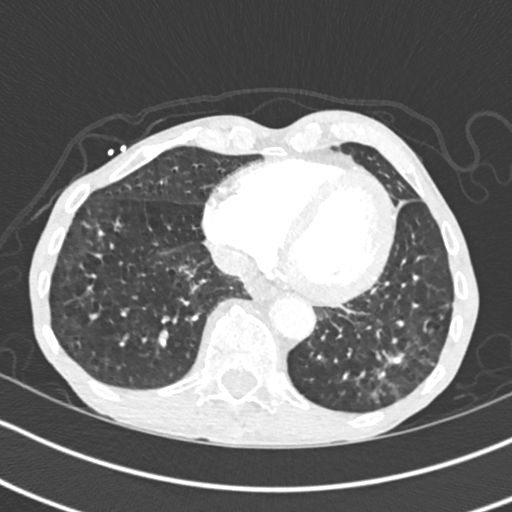
[im 99/345  soft-tissue]
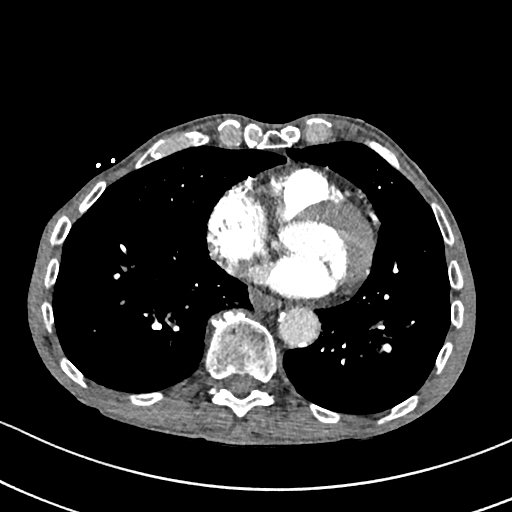
[im 123/345  lung]
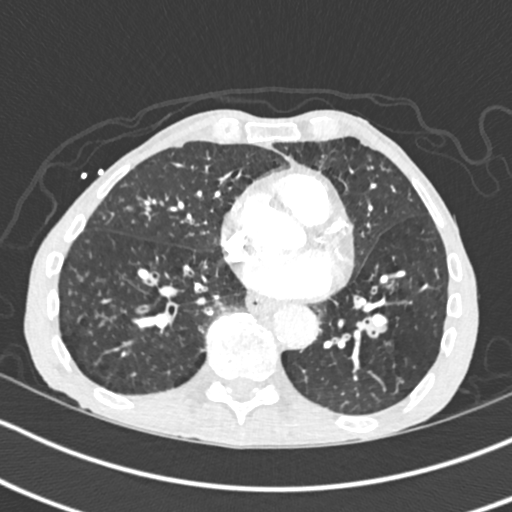
[im 148/345  soft-tissue]
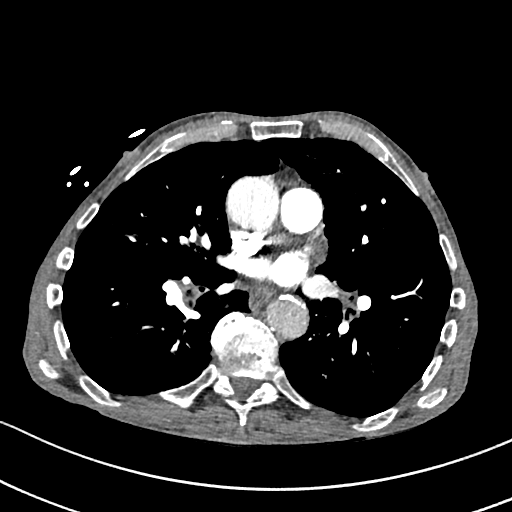
[im 173/345  lung]
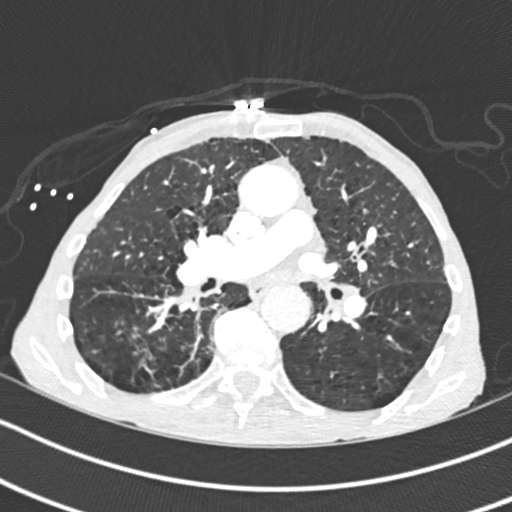
[im 197/345  soft-tissue]
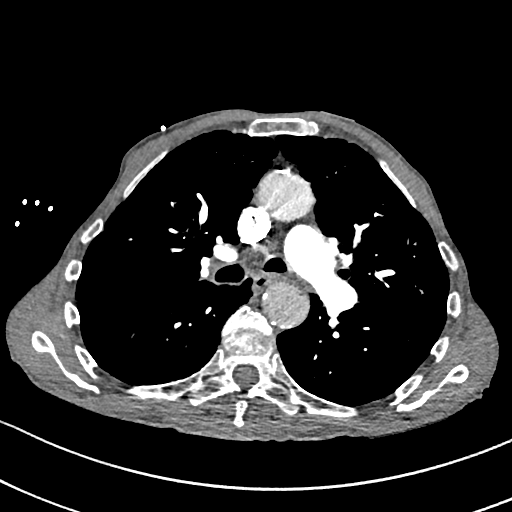
[im 222/345  lung]
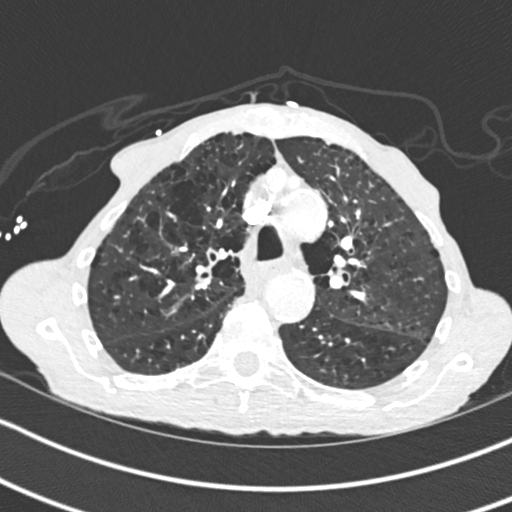
[im 246/345  soft-tissue]
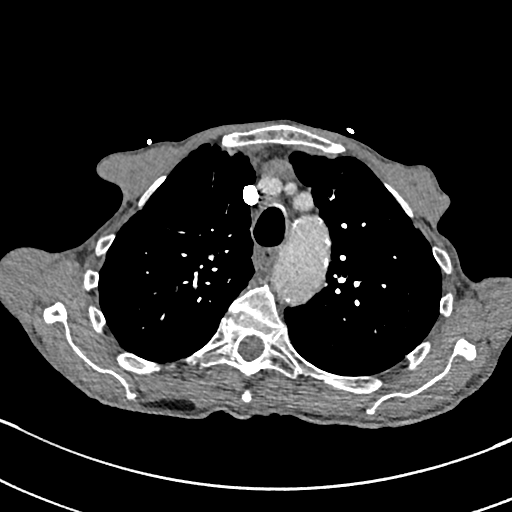
[im 271/345  lung]
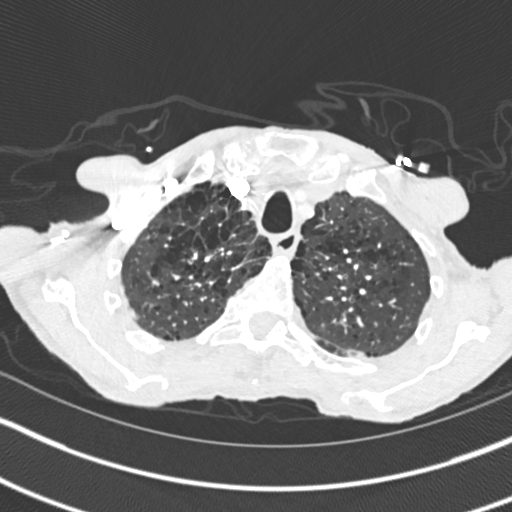
[im 295/345  soft-tissue]
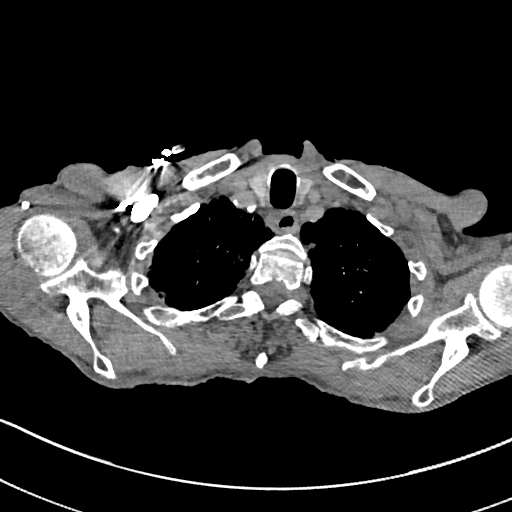
[im 320/345  lung]
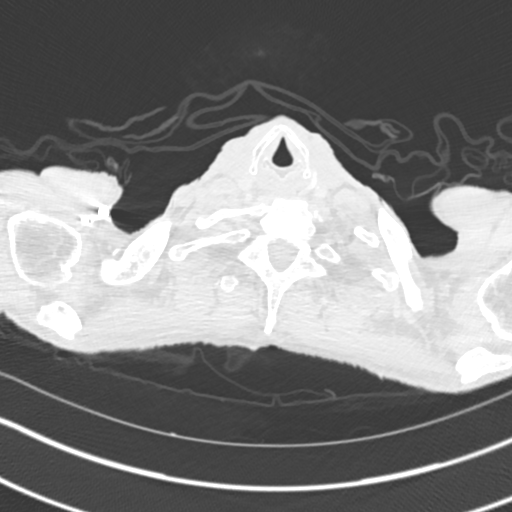

[Series 7: coronal mpr · coronal · 0.62mm/px · 2 of 150 slices shown]
[im 50/150  soft-tissue]
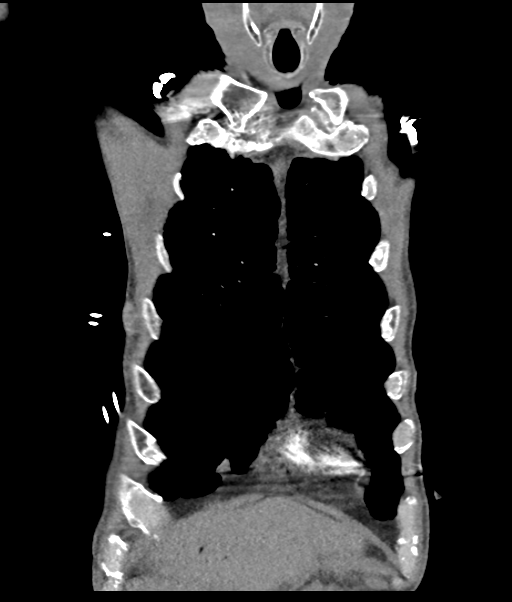
[im 100/150  soft-tissue]
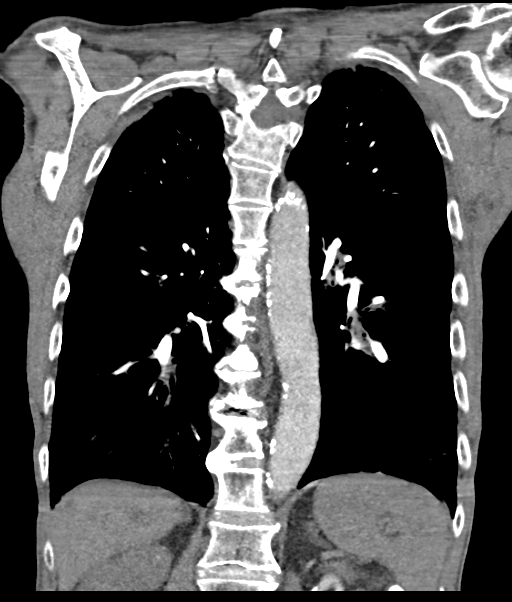

[15 of 46 positions shown; findings below may reference images not displayed]

FINDINGS: CTA CHEST FINDINGS

Mediastinum: The heart size appears normal. There is no pericardial
effusion identified. Calcified atherosclerotic plaque involves the
LAD and RCA coronary arteries. The trachea appears patent and is
midline. Mild circumferential wall thickening involving the
esophagus is noted. No enlarged mediastinal or hilar lymph nodes
identified. The main pulmonary artery appears patent. There are no
abnormal filling defects within the main pulmonary artery or its
branches to suggest an acute pulmonary embolus.

Lungs/Pleura: Moderate to advanced changes of centrilobular and
paraseptal emphysema identified. Moderate bilateral bronchial wall
thickening is identified. Scattered areas of tree-in-bud nodularity
are identified and appear lower lobe predominant compatible with
bronchiolitis. Focal area of subsegmental consolidation or
atelectasis is noted in the left base. Scarring is noted within both
lung apices.

Musculoskeletal: There is mild multi level spondylosis and mild
scoliosis involving the thoracic spine. No aggressive lytic or
sclerotic bone lesions.

CT ABDOMEN and PELVIS FINDINGS

Hepatobiliary: Cyst is noted within the right hepatic lobe. 5 mm low
attenuation structure in the lateral segment of left hepatic lobe is
too small to characterize. Previous cholecystectomy. There is
pneumobilia compatible with biliary patency. No significant biliary
dilatation.

Pancreas: Previous Whipple procedure.

Spleen: The spleen appears normal. The urinary bladder appears
normal.

Adrenals/Urinary Tract: Bilateral renal cysts are identified.
Several complex cysts are noted within the left kidney. Index lesion
within the upper pole measures 1.1 cm and has internal Hounsfield
units as high as 98 suggesting enhancement. No evidence for
obstructive uropathy.

Stomach/Bowel: The stomach is unremarkable. Postoperative change
from previous Whipple procedure identified. The small bowel loops
have a normal course and caliber. There is no evidence for bowel
obstruction. The colon is unremarkable. A moderate stool burden is
identified within the rectum.

Vascular/Lymphatic: Calcified atherosclerotic disease involves the
abdominal aorta. No aneurysm. There is no retroperitoneal adenopathy
identified. There is no pelvic or inguinal adenopathy identified.

Reproductive: Prostate gland is enlarged.

Other: No ascites or fluid collections within the abdomen or pelvis.

Musculoskeletal: Degenerative disc disease is noted within the
lumbar spine. There are no aggressive lytic or sclerotic bone
lesions identified.

Review of the MIP images confirms the above findings.
IMPRESSION: 1. No acute findings within the chest. No evidence for acute
pulmonary embolus.
2. Emphysema.
3. Diffuse bronchial wall thickening and evidence of bronchiolitis.
Findings are likely chronic.
4. No acute findings within the abdomen or pelvis.
5. Status post Whipple procedure. Pneumobilia is noted compatible
with biliary patency.
6. 5 mm low attenuation structure in the left hepatic lobe is too
small to characterize.
7. There is a possible enhancing lesion within the left kidney.
Further evaluation with nonemergent MRI of the kidneys is
recommended to evaluate for solid kidney lesion which may indicate a
renal cell carcinoma.
8. Atherosclerotic disease including multi vessel coronary artery
calcifications.
9. Prostate gland enlargement.
10. Scoliosis and degenerative disc disease within the thoracic and
lumbar spine.
